# Patient Record
Sex: Female | Born: 1945 | Race: White | Hispanic: No | Marital: Married | Smoking: Never smoker
Health system: Southern US, Community
[De-identification: ages and names within clinical notes are randomized; demographics above are authoritative.]

## PROBLEM LIST (undated history)

## (undated) DIAGNOSIS — E119 Type 2 diabetes mellitus without complications: Secondary | ICD-10-CM

## (undated) DIAGNOSIS — R7301 Impaired fasting glucose: Secondary | ICD-10-CM

## (undated) DIAGNOSIS — K760 Fatty (change of) liver, not elsewhere classified: Secondary | ICD-10-CM

## (undated) DIAGNOSIS — E039 Hypothyroidism, unspecified: Secondary | ICD-10-CM

## (undated) DIAGNOSIS — I720 Aneurysm of carotid artery: Secondary | ICD-10-CM

## (undated) DIAGNOSIS — N19 Unspecified kidney failure: Secondary | ICD-10-CM

## (undated) DIAGNOSIS — M1990 Unspecified osteoarthritis, unspecified site: Secondary | ICD-10-CM

## (undated) DIAGNOSIS — R569 Unspecified convulsions: Secondary | ICD-10-CM

## (undated) DIAGNOSIS — N289 Disorder of kidney and ureter, unspecified: Secondary | ICD-10-CM

## (undated) DIAGNOSIS — E785 Hyperlipidemia, unspecified: Secondary | ICD-10-CM

## (undated) DIAGNOSIS — I639 Cerebral infarction, unspecified: Secondary | ICD-10-CM

## (undated) DIAGNOSIS — G4730 Sleep apnea, unspecified: Secondary | ICD-10-CM

## (undated) DIAGNOSIS — I10 Essential (primary) hypertension: Secondary | ICD-10-CM

## (undated) HISTORY — PX: APPENDECTOMY: SHX54

## (undated) HISTORY — PX: BLADDER REPAIR: SHX76

## (undated) HISTORY — PX: HERNIA REPAIR: SHX51

## (undated) HISTORY — PX: ABDOMINAL HYSTERECTOMY: SHX81

## (undated) HISTORY — PX: CHOLECYSTECTOMY: SHX55

---

## 2019-07-04 ENCOUNTER — Emergency Department (HOSPITAL_BASED_OUTPATIENT_CLINIC_OR_DEPARTMENT_OTHER): Payer: Medicare Other

## 2019-07-04 ENCOUNTER — Emergency Department (HOSPITAL_BASED_OUTPATIENT_CLINIC_OR_DEPARTMENT_OTHER)
Admission: EM | Admit: 2019-07-04 | Discharge: 2019-07-04 | Disposition: A | Discharge status: Home / Self Care | Payer: Medicare Other | Attending: Emergency Medicine | Admitting: Emergency Medicine

## 2019-07-04 ENCOUNTER — Other Ambulatory Visit: Payer: Self-pay

## 2019-07-04 ENCOUNTER — Encounter (HOSPITAL_BASED_OUTPATIENT_CLINIC_OR_DEPARTMENT_OTHER): Payer: Self-pay

## 2019-07-04 DIAGNOSIS — E876 Hypokalemia: Secondary | ICD-10-CM | POA: Insufficient documentation

## 2019-07-04 DIAGNOSIS — E119 Type 2 diabetes mellitus without complications: Secondary | ICD-10-CM | POA: Diagnosis not present

## 2019-07-04 DIAGNOSIS — N3001 Acute cystitis with hematuria: Secondary | ICD-10-CM | POA: Insufficient documentation

## 2019-07-04 DIAGNOSIS — Z880 Allergy status to penicillin: Secondary | ICD-10-CM | POA: Insufficient documentation

## 2019-07-04 DIAGNOSIS — I10 Essential (primary) hypertension: Secondary | ICD-10-CM | POA: Insufficient documentation

## 2019-07-04 DIAGNOSIS — R519 Headache, unspecified: Secondary | ICD-10-CM | POA: Diagnosis not present

## 2019-07-04 DIAGNOSIS — B9689 Other specified bacterial agents as the cause of diseases classified elsewhere: Secondary | ICD-10-CM | POA: Insufficient documentation

## 2019-07-04 DIAGNOSIS — M542 Cervicalgia: Secondary | ICD-10-CM | POA: Diagnosis present

## 2019-07-04 HISTORY — DX: Impaired fasting glucose: R73.01

## 2019-07-04 HISTORY — DX: Disorder of kidney and ureter, unspecified: N28.9

## 2019-07-04 HISTORY — DX: Essential (primary) hypertension: I10

## 2019-07-04 HISTORY — DX: Hyperlipidemia, unspecified: E78.5

## 2019-07-04 HISTORY — DX: Unspecified kidney failure: N19

## 2019-07-04 HISTORY — DX: Aneurysm of carotid artery: I72.0

## 2019-07-04 HISTORY — DX: Cerebral infarction, unspecified: I63.9

## 2019-07-04 HISTORY — DX: Hypothyroidism, unspecified: E03.9

## 2019-07-04 HISTORY — DX: Type 2 diabetes mellitus without complications: E11.9

## 2019-07-04 HISTORY — DX: Fatty (change of) liver, not elsewhere classified: K76.0

## 2019-07-04 HISTORY — DX: Sleep apnea, unspecified: G47.30

## 2019-07-04 HISTORY — DX: Unspecified convulsions: R56.9

## 2019-07-04 HISTORY — DX: Unspecified osteoarthritis, unspecified site: M19.90

## 2019-07-04 LAB — CBC WITH DIFFERENTIAL/PLATELET
Abs Immature Granulocytes: 0.02 10*3/uL (ref 0.00–0.07)
Basophils Absolute: 0 10*3/uL (ref 0.0–0.1)
Basophils Relative: 0 %
Eosinophils Absolute: 0.1 10*3/uL (ref 0.0–0.5)
Eosinophils Relative: 1 %
HCT: 44.4 % (ref 36.0–46.0)
Hemoglobin: 14.7 g/dL (ref 12.0–15.0)
Immature Granulocytes: 0 %
Lymphocytes Relative: 15 %
Lymphs Abs: 1.2 10*3/uL (ref 0.7–4.0)
MCH: 30.8 pg (ref 26.0–34.0)
MCHC: 33.1 g/dL (ref 30.0–36.0)
MCV: 93.1 fL (ref 80.0–100.0)
Monocytes Absolute: 0.5 10*3/uL (ref 0.1–1.0)
Monocytes Relative: 6 %
Neutro Abs: 5.9 10*3/uL (ref 1.7–7.7)
Neutrophils Relative %: 78 %
Platelets: 240 10*3/uL (ref 150–400)
RBC: 4.77 MIL/uL (ref 3.87–5.11)
RDW: 12.9 % (ref 11.5–15.5)
WBC: 7.7 10*3/uL (ref 4.0–10.5)
nRBC: 0 % (ref 0.0–0.2)

## 2019-07-04 LAB — URINALYSIS, MICROSCOPIC (REFLEX)

## 2019-07-04 LAB — COMPREHENSIVE METABOLIC PANEL
ALT: 19 U/L (ref 0–44)
AST: 25 U/L (ref 15–41)
Albumin: 4.2 g/dL (ref 3.5–5.0)
Alkaline Phosphatase: 80 U/L (ref 38–126)
Anion gap: 13 (ref 5–15)
BUN: 9 mg/dL (ref 8–23)
CO2: 27 mmol/L (ref 22–32)
Calcium: 9 mg/dL (ref 8.9–10.3)
Chloride: 99 mmol/L (ref 98–111)
Creatinine, Ser: 1 mg/dL (ref 0.44–1.00)
GFR calc Af Amer: >60 mL/min (ref >60)
GFR calc non Af Amer: 55 mL/min — ABNORMAL LOW (ref >60)
Glucose, Bld: 96 mg/dL (ref 70–99)
Potassium: 3.3 mmol/L — ABNORMAL LOW (ref 3.5–5.1)
Sodium: 139 mmol/L (ref 135–145)
Total Bilirubin: 1 mg/dL (ref 0.3–1.2)
Total Protein: 7.2 g/dL (ref 6.5–8.1)

## 2019-07-04 LAB — URINALYSIS, ROUTINE W REFLEX MICROSCOPIC
Bilirubin Urine: NEGATIVE
Glucose, UA: NEGATIVE mg/dL
Ketones, ur: 40 mg/dL — AB
Nitrite: NEGATIVE
Protein, ur: NEGATIVE mg/dL
Specific Gravity, Urine: 1.01 (ref 1.005–1.030)
pH: 8.5 — ABNORMAL HIGH (ref 5.0–8.0)

## 2019-07-04 LAB — LIPASE, BLOOD: Lipase: 20 U/L (ref 11–51)

## 2019-07-04 MED ORDER — DOXYCYCLINE HYCLATE 100 MG PO CAPS: 100.0000 mg | ORAL_CAPSULE | Freq: Two times a day (BID) | ORAL | 0 refills | Status: AC

## 2019-07-04 MED ORDER — ONDANSETRON HCL 4 MG/2ML IJ SOLN
4.0000 mg | Freq: Once | INTRAMUSCULAR | Status: AC
  Administered 2019-07-04: 4 mg via INTRAVENOUS
  Filled 2019-07-04: qty 2

## 2019-07-04 NOTE — Discharge Instructions (Signed)
It was wonderful to meet you today.  It is likely that you have a UTI, your blood cultures and urine cultures are pending.  I have sent in doxycycline for you to take twice daily for the next 7 days.  Make sure that you get potassium from potassium rich food including bananas and green leafy vegetables.  Please make sure that you stay well-hydrated and eat foods that feel easy on your stomach.  Follow-up if you have any worsening urinary symptoms, chest pain, shortness of breath, inability to maintain hydration, weakness/numbness, or confusion.  CT abdomen findings to follow up and show pcp:  1. Mild thickening versus underdistention of the descending and sigmoid colon. Correlate for symptoms of mild colitis. 2. Circumscribed fat density the deep pelvis adjacent the rectosigmoid, could reflect a small inflamed epiploic appendage though is age indeterminate. 3. Superior endplate deformity T12 is new from prior with small superimposed Schmorl's node formation, likely degenerative or remote compression deformity. Correlate for point tenderness. 4. Postsurgical changes at the GE junction likely reflecting prior fundoplication.

## 2019-07-04 NOTE — ED Provider Notes (Signed)
[facility] EMERGENCY DEPARTMENT Provider Note   CSN: [id number] Arrival date & time: 07/04/19  1537     History Chief Complaint  Patient presents with  . Neck Pain    Rebecca Schwartz is a 74 y.o. female with a history of GAD, bipolar affective disorder, previous CVA, and recurrent UTIs presenting for evaluation of multiple complaints.  She reports severe fatigue, a temperature of 100.36F for the past 3 days, and left lower quadrant abdominal pain associated with nausea and anorexia.  Has not been able to eat much, reports several year history of diarrhea and has not had a bowel movement in 2 days due to antidiarrheal medication.  She reports for the past 3 days she has been sleeping all day and at night.  Reports she has had neck pain and headaches since 2018 after a fall.  She also states she had a E. coli UTI several weeks ago and is worried that it is now in her bloodstream.  In discussion of her neck pain which initially was her chief complaint, she states that this is chronic and largely unchanged. She reports feeling "spacey ".  Reports a history of strokes and worried that "E. coli is in my brainstem."  Adamantly requests blood cultures be taken.  Denies any dysuria, urinary urgency, or suprapubic abdominal pain, but reports she often has vague symptoms as above when she has a UTI.  Think she may have urinary frequency.  Has had about 5 UTIs in the past year.  She spends a large portion of evaluation discussing previous medical concerns that she has had over the past many years and reminisces about her family.  She also states that she recently saw a different provider earlier today who sent her over here.  In chart review, note she was seen in urgent care for non-intractable headache.  She does not endorse headache at this time, frequently expresses concern for E. coli instead. Reviewed that she reported ringing in her ears and that "something was wrong inside of her head" at that  time.  Dr. Madilyn Hookees additionally spoke with the patient's husband on his own outside of the room.  He reports this is her normal baseline behavior.  He has not noticed any change in her mood, behavior, or memory at home.  States she is often worried about it E. coli UTIs and that she wanted to be checked out for this.     Past Medical History:  Diagnosis Date  . Arthritis   . Carotid aneurysm, right (HCC)   . Diabetes mellitus without complication (HCC)   . Fatty liver   . Hyperlipidemia   . Hypertension   . Hypothyroid   . Impaired fasting glucose   . Kidney failure   . Renal disorder   . Seizures (HCC)   . Sleep apnea   . Stroke [facility](HCC)     There are no problems to display for this patient.   Past Surgical History:  Procedure Laterality Date  . ABDOMINAL HYSTERECTOMY    . APPENDECTOMY    . BLADDER REPAIR    . CHOLECYSTECTOMY    . HERNIA REPAIR       OB History   No obstetric history on file.     No family history on file.  Social History   Tobacco Use  . Smoking status: Never Smoker  . Smokeless tobacco: Never Used  Substance Use Topics  . Alcohol use: Never  . Drug use: Never  Home Medications Prior to Admission medications   Medication Sig Start Date End Date Taking? Authorizing Provider  doxycycline (VIBRAMYCIN) 100 MG capsule Take 1 capsule (100 mg total) by mouth 2 (two) times daily for 7 days. 07/04/19 07/11/19  Patriciaann Clan, DO    Allergies    Contrast media [iodinated diagnostic agents], Levaquin [levofloxacin], Penicillins, Sulfa antibiotics, and Yellow dyes (non-tartrazine)  Review of Systems   Review of Systems  Constitutional: Positive for fatigue and fever. Negative for chills.  Respiratory: Negative for cough and shortness of breath.   Cardiovascular: Negative for chest pain and palpitations.  Gastrointestinal: Positive for abdominal pain, diarrhea and nausea. Negative for vomiting.  Genitourinary: Positive for frequency. Negative for  decreased urine volume, dysuria and urgency.  Musculoskeletal: Positive for neck pain.  Skin: Negative for rash.  Neurological: Negative for dizziness, seizures, syncope, weakness, light-headedness and numbness.  Psychiatric/Behavioral: Positive for behavioral problems. Negative for agitation and hallucinations. The patient is nervous/anxious.     Physical Exam Updated Vital Signs BP 131/75   Pulse 74   Temp 98.7 F (37.1 C) (Oral)   Resp 16   Ht 5\' 1"  (1.549 m)   Wt 58.3 kg   SpO2 95%   BMI 24.29 kg/m   Physical Exam Constitutional:      General: She is not in acute distress.    Appearance: Normal appearance. She is not toxic-appearing.     Comments: Pleasant. Tearful during conversation.  Husband at bedside.  HENT:     Head: Normocephalic and atraumatic.     Mouth/Throat:     Mouth: Mucous membranes are moist.     Pharynx: No oropharyngeal exudate.  Eyes:     Extraocular Movements: Extraocular movements intact.  Cardiovascular:     Rate and Rhythm: Normal rate and regular rhythm.  Pulmonary:     Effort: Pulmonary effort is normal.     Breath sounds: Normal breath sounds.  Abdominal:     General: Abdomen is flat. There is no distension.     Palpations: Abdomen is soft.     Tenderness: There is no guarding or rebound.     Comments: Tender to the left lower quadrant with light palpation.  Normoactive bowel sounds throughout.  Musculoskeletal:     Cervical back: Normal range of motion and neck supple. No rigidity.     Comments: Normal gait with cane.  Moving all extremities spontaneously and equally, 5/5 upper and lower extremity strength.  No point tenderness on cervical, thoracic, or lumbar spine, mild tenderness diffusely around paraspinal musculature.  Skin:    General: Skin is warm and dry.     Findings: No lesion or rash.  Neurological:     Mental Status: She is alert and oriented to person, place, and time.     Comments: Alert.  Follows commands appropriately.   Smile symmetrical.  PERRLA, EOMI. sensation to light touch intact throughout upper and lower extremity.  No pronator drift.  Psychiatric:     Comments: Tearful and anxious mood and affect.  Able to hold conversation and answer questions appropriately, however often tangential.  Does not appear to be responding to internal stimuli.    ED Results / Procedures / Treatments   Labs (all labs ordered are listed, but only abnormal results are displayed) Labs Reviewed  COMPREHENSIVE METABOLIC PANEL - Abnormal; Notable for the following components:      Result Value   Potassium 3.3 (*)    GFR calc non Af Amer 55 (*)  All other components within normal limits  URINALYSIS, ROUTINE W REFLEX MICROSCOPIC - Abnormal; Notable for the following components:   pH 8.5 (*)    Hgb urine dipstick TRACE (*)    Ketones, ur 40 (*)    Leukocytes,Ua TRACE (*)    All other components within normal limits  URINALYSIS, MICROSCOPIC (REFLEX) - Abnormal; Notable for the following components:   Bacteria, UA MANY (*)    All other components within normal limits  URINE CULTURE  CULTURE, BLOOD (ROUTINE X 2)  CULTURE, BLOOD (ROUTINE X 2)  LIPASE, BLOOD  CBC WITH DIFFERENTIAL/PLATELET    EKG None  Radiology CT ABDOMEN PELVIS WO CONTRAST  Result Date: 07/04/2019 CLINICAL DATA:  Diverticulitis suspected, left lower quadrant abdominal pain EXAM: CT ABDOMEN AND PELVIS WITHOUT CONTRAST TECHNIQUE: Multidetector CT imaging of the abdomen and pelvis was performed following the standard protocol without IV contrast. COMPARISON:  CT 12/27/2012 FINDINGS: Lower chest: Lung bases are clear. Normal heart size. No pericardial effusion. Few coronary artery calcifications are present. Hepatobiliary: No focal liver abnormality is seen. Patient is post cholecystectomy. Slight prominence of the biliary tree likely related to reservoir effect. No calcified intraductal gallstones. Pancreas: Unremarkable. No pancreatic ductal dilatation or  surrounding inflammatory changes. Spleen: Normal in size without focal abnormality. Adrenals/Urinary Tract: Adrenal glands are unremarkable. No visible or contour deforming renal lesions. No urolithiasis or hydronephrosis. There is some mild circumferential bladder wall thickening with faint perivesicular hazy stranding. No bladder calculi or debris. Stomach/Bowel: Postsurgical changes at the GE junction likely reflecting prior fundoplication with small focus of gas at the fundoplication wrap. Distal stomach is unremarkable. Duodenum takes a normal course across the midline abdomen. No small bowel thickening or dilatation. Slightly mobile cecum displaced to the midline abdomen, no evidence of volvulus. The appendix is surgically absent. There is some mild fatty infiltration of the cecum which could reflect sequela of chronic inflammatory bowel disease or obese body habitus. Question some mild edematous mural thickening of the descending colon and sigmoid without significant pericolonic stranding. Scattered colonic diverticula without focal inflammation to suggest diverticulitis. Circumscribed fat density is seen the deep pelvis adjacent the rectosigmoid (coronal 5/55) could reflect a small torsed epiploic appendage. No evidence of gross bowel obstruction. No pneumatosis or portal venous gas. Vascular/Lymphatic: Atherosclerotic calcifications throughout the abdominal aorta and branch vessels. No aneurysm or ectasia. No other acute vascular abnormality on this limited unenhanced evaluation. No enlarged abdominopelvic lymph nodes. Reproductive: Uterus is surgically absent. No concerning adnexal lesions. Other: Postsurgical changes of prior ventral hernia repair. No abdominopelvic free air or fluid. Musculoskeletal: Superior endplate deformity T12 is new from prior with small superimposed Schmorl's node formation, could reflect degenerative change or remote posttraumatic change, likely to be acute. 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 IMPRESSION: 1. Mild thickening versus underdistention of the descending and sigmoid colon. Correlate for symptoms of mild colitis. 2. Circumscribed fat density the deep pelvis adjacent the rectosigmoid, could reflect a small inflamed epiploic appendage though is age indeterminate. 3. Superior endplate deformity T12 is new from prior with small superimposed Schmorl's node formation, likely degenerative or remote compression deformity. Correlate for point tenderness. 4. Postsurgical changes at the GE junction likely reflecting prior fundoplication. 5. Prior appendectomy and hysterectomy as well. 6. Aortic Atherosclerosis (ICD10-I70.0). Electronically Signed   By: Kreg Shropshire M.D.   On: 07/04/2019 18:56   CT Head Wo Contrast  Result  Date: 07/04/2019 CLINICAL DATA:  Headache, normal neuro exam, neck pain EXAM: CT HEAD WITHOUT CONTRAST TECHNIQUE: Contiguous axial images were obtained from the base of the skull through the vertex without intravenous contrast. COMPARISON:  Contemporary CT of the abdomen and pelvis. CT head 07/16/2016 FINDINGS: Please note: At the time of interpretation the CT head images are contained within the concurrently performed CT of the abdomen and pelvis, accession number: [id number] CHL. Images are to be placed in the correct accession number as soon as possible. Brain: No evidence of acute infarction, hemorrhage, hydrocephalus, extra-axial collection or mass lesion/mass effect. Symmetric prominence of the ventricles, cisterns and sulci compatible with parenchymal volume loss. Patchy areas of white matter hypoattenuation are most compatible with chronic microvascular angiopathy. Midline intracranial structures are unremarkable. Cerebellar tonsils normally positioned. Vascular: Atherosclerotic calcification of the carotid siphons and  intradural vertebral arteries. No hyperdense vessel. Skull: No calvarial fracture or suspicious osseous lesion. No scalp swelling or hematoma. Sinuses/Orbits: Paranasal sinuses and mastoid air cells are predominantly clear. Included orbital structures are unremarkable. Other: None IMPRESSION: 1. No acute intracranial abnormality. 2. Chronic microvascular angiopathy and parenchymal volume loss, similar to prior Please note: At the time of interpretation the CT head images are contained within the concurrently performed CT abdomen and pelvis, accession number, accession number [id number] CHL. Images are to be placed in the correct accession number as soon as soon as possible. Electronically Signed   By: Kreg Shropshire M.D.   On: 07/04/2019 18:59    Procedures Procedures (including critical care time)  Medications Ordered in ED Medications  ondansetron (ZOFRAN) injection 4 mg (4 mg Intravenous Given 07/04/19 1739)    ED Course  I have reviewed the triage vital signs and the nursing notes.  Pertinent labs & imaging results that were available during my care of the patient were reviewed by me and considered in my medical decision making (see chart for details).    MDM Rules/Calculators/A&P                         74 year old female with a history of CVA, anxiety, recurrent UTIs, and bipolar affective disorder presenting for evaluation of multiple complaints including abdominal and neck pain, fever, and malaise for the past 3 days.  She is afebrile and hemodynamically stable with LLQ abdominal pain to palpation, frequently tearful and tangential during evaluation.  Neurologically intact.  Initial differential including diverticulitis, colitis, UTI, electrolyte abnormality, viral illness, liver/renal dysfunction, sequela of known anxiety and bipolar affective disorder.  Doubt CVA with nonfocal neurologic exam.  Obtained CBC, CMP, lipase, U/A.  Hemoglobin WNL, no leukocytosis.  Electrolytes and glucose wnl  with the exception of mild hypokalemia at 3.3, otherwise renal and liver function unremarkable.  Lipase WNL. U/a with trace hemoglobin, leukocyte esterase, bacteria suggestive of UTI.  Urine culture pending.  CT abdomen showing mild thickening vs underdistention of sigmoid and transverse colon possibly suggestive of mild colitis without evidence of diverticulitis.  No pneumatosis or free air within the abdomen.  CT head without acute intracranial abnormalities.  Blood cultures obtained due to patient request and pending, doubt bacteremia given overall well appearance and hemodynamic stability.  Provided Rx for doxycycline BID x 7 days for UTI, limited options available as patient reports history of anaphylaxis to penicillins and yellow dye and known allergy to fluoroquinolones.  Encouraged frequent hydration. Tylenol for pain relief. Discussed potassium rich food intake as all potassium supplements available also contain yellow dye.  Informed patient  of CT abdomen findings and new superior T12 deformity, reassuringly without associated extremity weakness/numbness or pinpoint tenderness in this area.  Recommended follow-up with PCP early next week for the above concerns or sooner if any worsening.  Final Clinical Impression(s) / ED Diagnoses Final diagnoses:  Acute cystitis with hematuria  Hypokalemia    Rx / DC Orders ED Discharge Orders         Ordered    doxycycline (VIBRAMYCIN) 100 MG capsule  2 times daily     Discontinue  Reprint     07/04/19 2024           Allayne Stack, DO 07/05/19 0033    Tilden Fossa, MD 07/10/19 [id number]

## 2019-07-04 NOTE — ED Triage Notes (Addendum)
Pt c/o intermittent pain to posterior neck and vision changes "since 2018"-pt denies neck injury-states "[facility] told me to come here"-pt registration is not complete/unable to see past medical record-NAD-to triage with steady gait with own cane

## 2019-07-04 NOTE — ED Notes (Signed)
Neck pain off and on x 4 years after a fall,  states while in hospital got ecoli and she thinks he has gone to her brain

## 2019-07-06 LAB — URINE CULTURE: Culture: <10000 — AB

## 2019-07-09 LAB — CULTURE, BLOOD (ROUTINE X 2)
Culture: NO GROWTH
Culture: NO GROWTH
Special Requests: ADEQUATE
Special Requests: ADEQUATE

## 2021-11-23 IMAGING — CT CT HEAD W/O CM
3 series · 14 of 47 positions shown, 16 images · non-contrast
Comparison: Contemporary CT of the abdomen and pelvis. CT head
07/16/2016

CLINICAL DATA: Headache, normal neuro exam, neck pain

EXAM:
CT HEAD WITHOUT CONTRAST
TECHNIQUE: Contiguous axial images were obtained from the base of the skull
through the vertex without intravenous contrast.

[Series 2: head wo · axial · 0.44mm/px · z∈[+822,+947]mm · 8 of 31 slices shown, 10 images]
[im 3/31  brain]
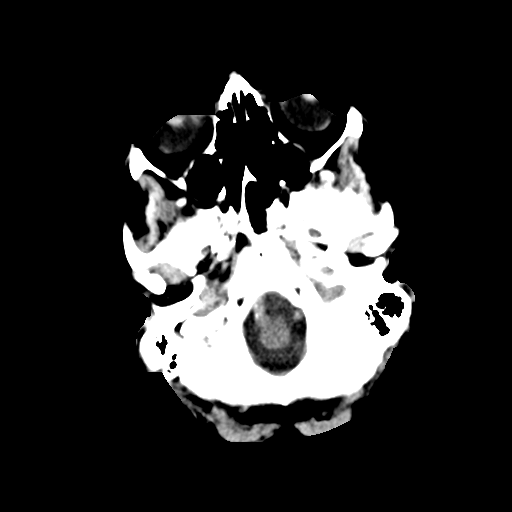
[im 3/31  bone]
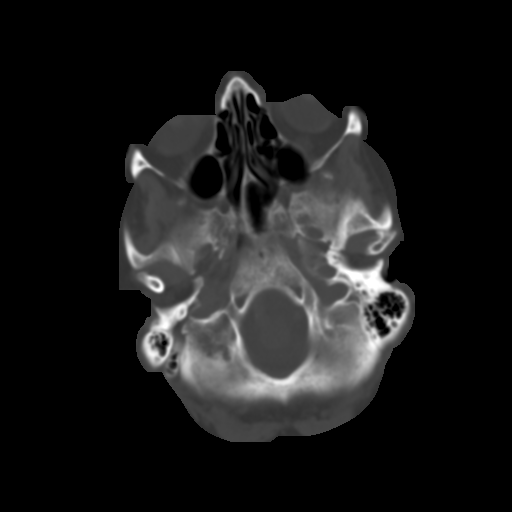
[im 7/31  brain]
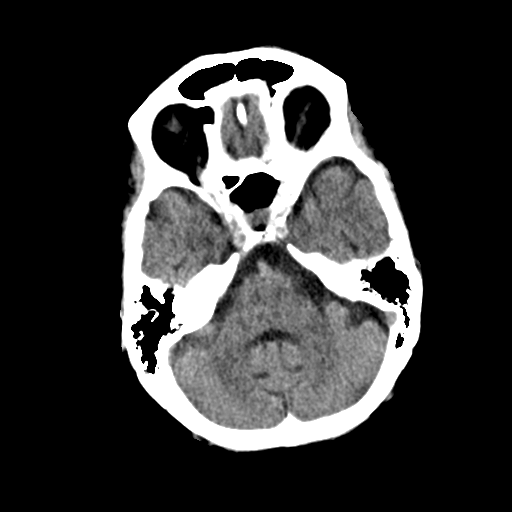
[im 10/31  brain]
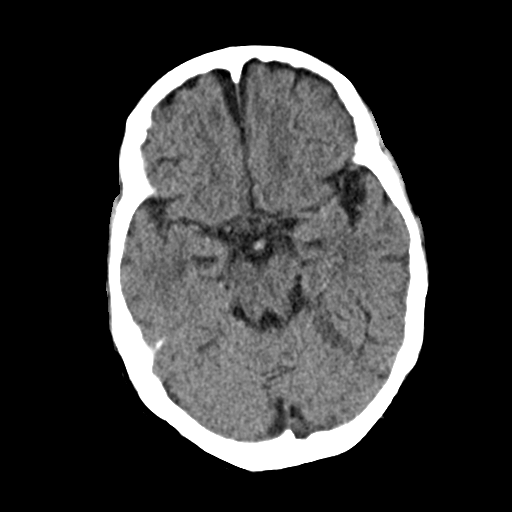
[im 14/31  brain]
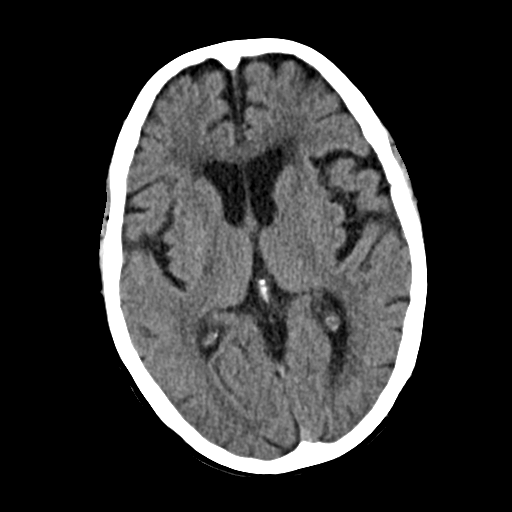
[im 17/31  brain]
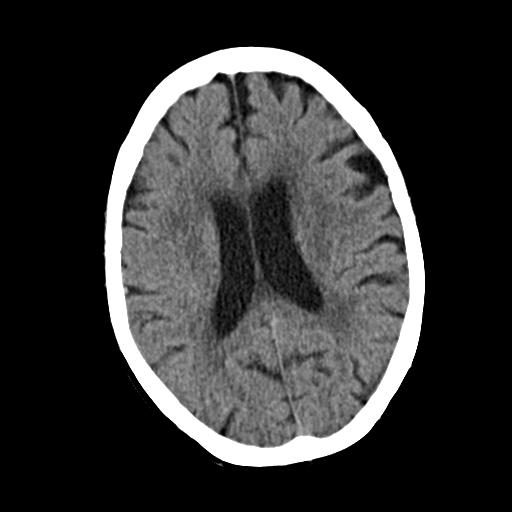
[im 17/31  bone]
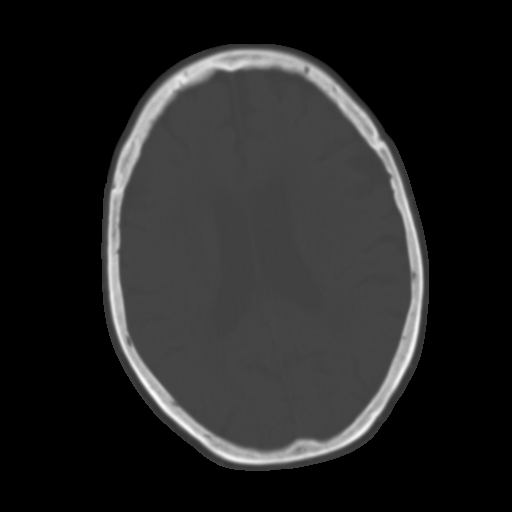
[im 21/31  brain]
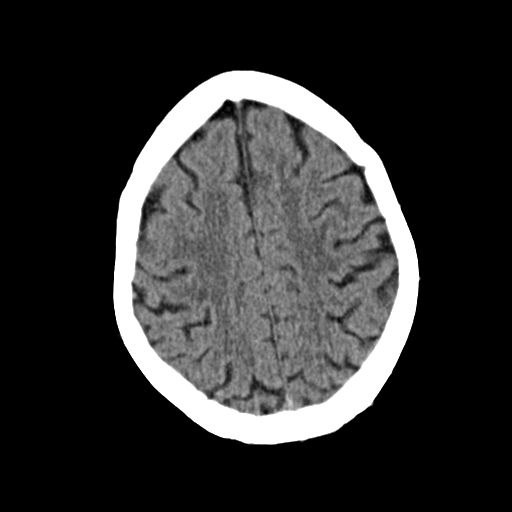
[im 24/31  brain]
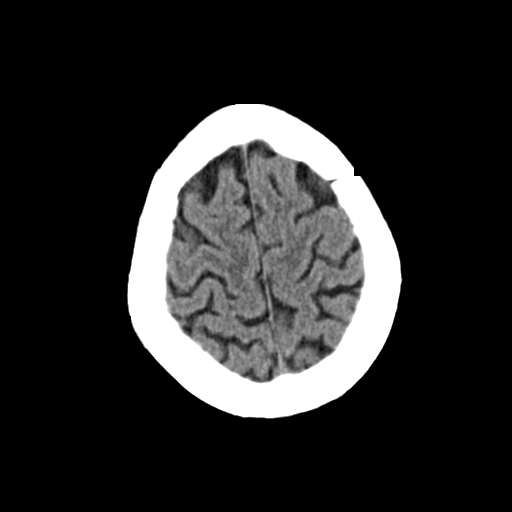
[im 28/31  brain]
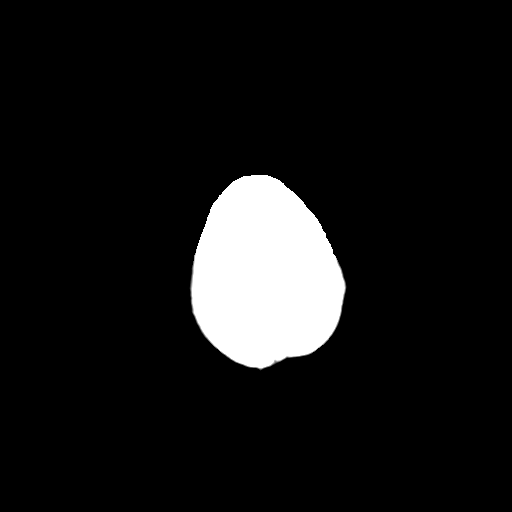

[Series 4: coronal soft · coronal · 0.30mm/px · 3 of 67 slices shown]
[im 23/67  brain]
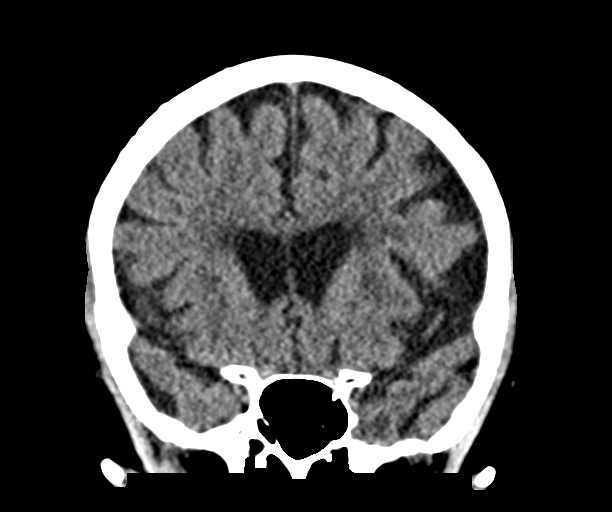
[im 30/67  brain]
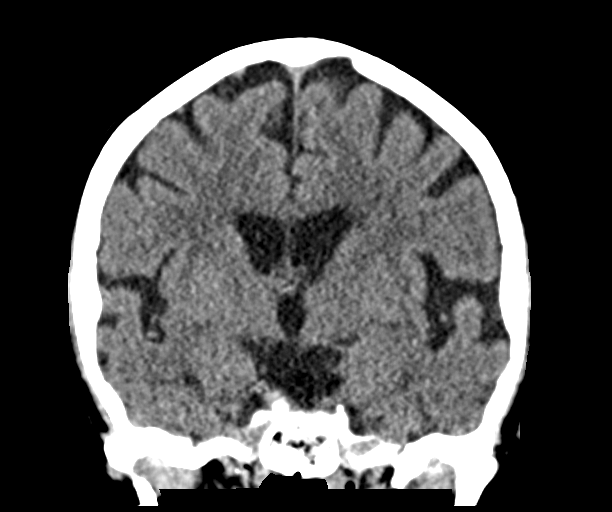
[im 37/67  brain]
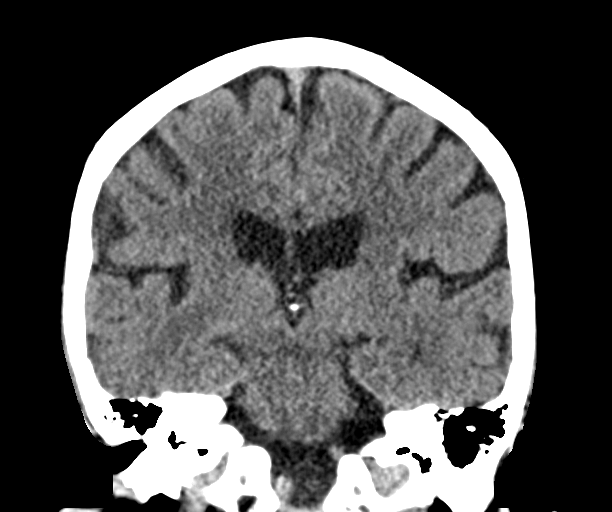

[Series 5: sag soft · sagittal · 0.30mm/px · 3 of 53 slices shown]
[im 18/53  brain]
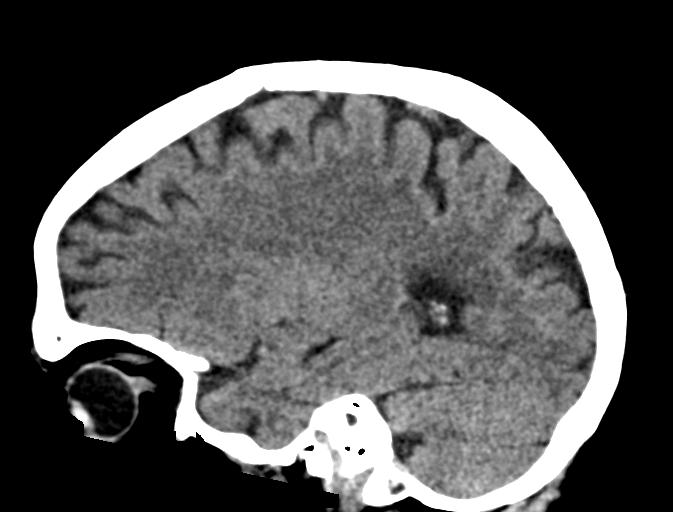
[im 27/53  brain]
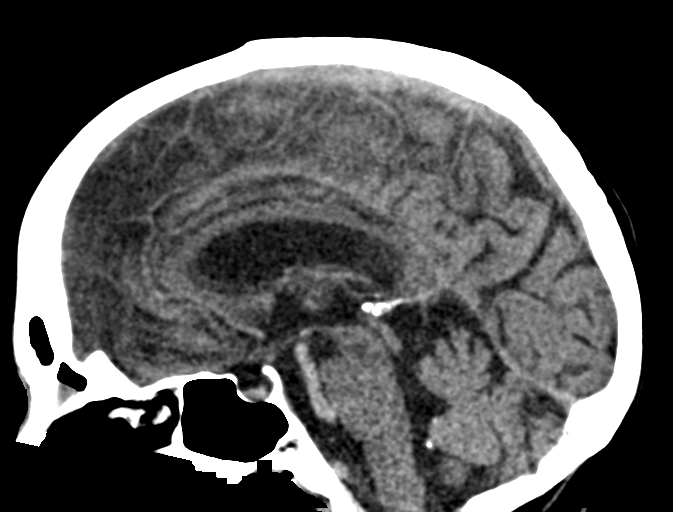
[im 35/53  brain]
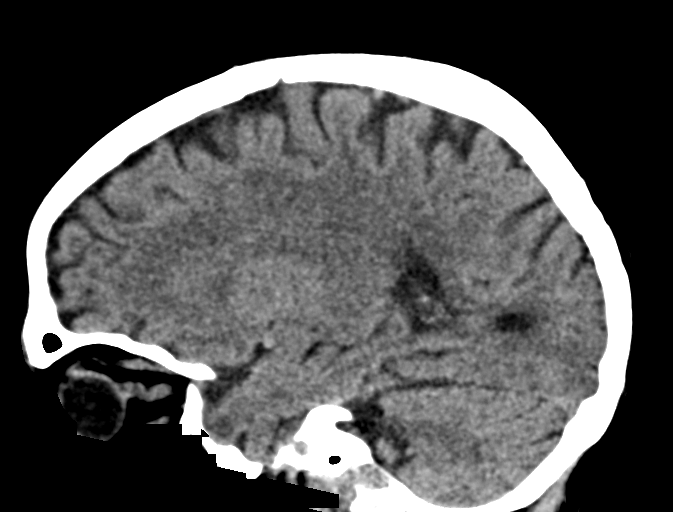

[14 of 47 positions shown; findings below may reference images not displayed]

FINDINGS: Please note: At the time of interpretation the CT head images are
contained within the concurrently performed CT of the abdomen and
pelvis, accession number: [id number] KEMPAN. Images are to be placed in
the correct accession number as soon as possible.

Brain: No evidence of acute infarction, hemorrhage, hydrocephalus,
extra-axial collection or mass lesion/mass effect. Symmetric
prominence of the ventricles, cisterns and sulci compatible with
parenchymal volume loss. Patchy areas of white matter
hypoattenuation are most compatible with chronic microvascular
angiopathy. Midline intracranial structures are unremarkable.
Cerebellar tonsils normally positioned.

Vascular: Atherosclerotic calcification of the carotid siphons and
intradural vertebral arteries. No hyperdense vessel.

Skull: No calvarial fracture or suspicious osseous lesion. No scalp
swelling or hematoma.

Sinuses/Orbits: Paranasal sinuses and mastoid air cells are
predominantly clear. Included orbital structures are unremarkable.

Other: None
IMPRESSION: 1. No acute intracranial abnormality.
2. Chronic microvascular angiopathy and parenchymal volume loss,
similar to prior

Please note: At the time of interpretation the CT head images are
contained within the concurrently performed CT abdomen and pelvis,
accession number, accession number [id number] KEMPAN. Images are to be
placed in the correct accession number as soon as soon as possible.
